# Patient Record
Sex: Female | Born: 2000 | Race: Black or African American | Hispanic: No | Marital: Single | Smoking: Never smoker
Health system: Southern US, Community
[De-identification: ages and names within clinical notes are randomized; demographics above are authoritative.]

## PROBLEM LIST (undated history)

## (undated) DIAGNOSIS — F509 Eating disorder, unspecified: Secondary | ICD-10-CM

## (undated) HISTORY — PX: EYE SURGERY: SHX253

---
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
## 2021-01-14 ENCOUNTER — Other Ambulatory Visit: Payer: Self-pay | Admitting: Surgery

## 2021-01-14 DIAGNOSIS — D241 Benign neoplasm of right breast: Secondary | ICD-10-CM

## 2021-02-05 ENCOUNTER — Other Ambulatory Visit: Payer: Self-pay
